# Patient Record
Sex: Male | Born: 1960 | Race: Black or African American | Hispanic: No | Marital: Married | Smoking: Former smoker
Health system: Southern US, Community
[De-identification: ages and names within clinical notes are randomized; demographics above are authoritative.]

## PROBLEM LIST (undated history)

## (undated) DIAGNOSIS — G43909 Migraine, unspecified, not intractable, without status migrainosus: Secondary | ICD-10-CM

## (undated) DIAGNOSIS — G4730 Sleep apnea, unspecified: Secondary | ICD-10-CM

## (undated) DIAGNOSIS — N289 Disorder of kidney and ureter, unspecified: Secondary | ICD-10-CM

## (undated) DIAGNOSIS — I10 Essential (primary) hypertension: Secondary | ICD-10-CM

## (undated) DIAGNOSIS — B351 Tinea unguium: Secondary | ICD-10-CM

## (undated) DIAGNOSIS — W3400XA Accidental discharge from unspecified firearms or gun, initial encounter: Secondary | ICD-10-CM

## (undated) DIAGNOSIS — J329 Chronic sinusitis, unspecified: Secondary | ICD-10-CM

## (undated) DIAGNOSIS — E119 Type 2 diabetes mellitus without complications: Secondary | ICD-10-CM

## (undated) DIAGNOSIS — I639 Cerebral infarction, unspecified: Secondary | ICD-10-CM

## (undated) HISTORY — PX: CHEST TUBE INSERTION: SHX231

---

## 1997-10-04 DIAGNOSIS — I639 Cerebral infarction, unspecified: Secondary | ICD-10-CM

## 1997-10-04 HISTORY — DX: Cerebral infarction, unspecified: I63.9

## 2016-06-02 ENCOUNTER — Emergency Department (HOSPITAL_COMMUNITY)

## 2016-06-02 ENCOUNTER — Encounter (HOSPITAL_COMMUNITY): Payer: Self-pay

## 2016-06-02 ENCOUNTER — Inpatient Hospital Stay (HOSPITAL_COMMUNITY)
Admission: EM | Admit: 2016-06-02 | Discharge: 2016-06-08 | DRG: 287 | Attending: Internal Medicine | Admitting: Internal Medicine

## 2016-06-02 DIAGNOSIS — I2510 Atherosclerotic heart disease of native coronary artery without angina pectoris: Secondary | ICD-10-CM | POA: Diagnosis not present

## 2016-06-02 DIAGNOSIS — E785 Hyperlipidemia, unspecified: Secondary | ICD-10-CM | POA: Diagnosis present

## 2016-06-02 DIAGNOSIS — R9431 Abnormal electrocardiogram [ECG] [EKG]: Principal | ICD-10-CM | POA: Diagnosis present

## 2016-06-02 DIAGNOSIS — Z79899 Other long term (current) drug therapy: Secondary | ICD-10-CM | POA: Diagnosis not present

## 2016-06-02 DIAGNOSIS — N183 Chronic kidney disease, stage 3 (moderate): Secondary | ICD-10-CM | POA: Diagnosis not present

## 2016-06-02 DIAGNOSIS — I69354 Hemiplegia and hemiparesis following cerebral infarction affecting left non-dominant side: Secondary | ICD-10-CM | POA: Diagnosis not present

## 2016-06-02 DIAGNOSIS — G4733 Obstructive sleep apnea (adult) (pediatric): Secondary | ICD-10-CM | POA: Diagnosis present

## 2016-06-02 DIAGNOSIS — H5712 Ocular pain, left eye: Secondary | ICD-10-CM

## 2016-06-02 DIAGNOSIS — E1122 Type 2 diabetes mellitus with diabetic chronic kidney disease: Secondary | ICD-10-CM | POA: Diagnosis present

## 2016-06-02 DIAGNOSIS — Z8249 Family history of ischemic heart disease and other diseases of the circulatory system: Secondary | ICD-10-CM | POA: Diagnosis not present

## 2016-06-02 DIAGNOSIS — E119 Type 2 diabetes mellitus without complications: Secondary | ICD-10-CM

## 2016-06-02 DIAGNOSIS — Z87891 Personal history of nicotine dependence: Secondary | ICD-10-CM

## 2016-06-02 DIAGNOSIS — I129 Hypertensive chronic kidney disease with stage 1 through stage 4 chronic kidney disease, or unspecified chronic kidney disease: Secondary | ICD-10-CM | POA: Diagnosis not present

## 2016-06-02 DIAGNOSIS — R079 Chest pain, unspecified: Secondary | ICD-10-CM

## 2016-06-02 DIAGNOSIS — R0600 Dyspnea, unspecified: Secondary | ICD-10-CM

## 2016-06-02 DIAGNOSIS — I10 Essential (primary) hypertension: Secondary | ICD-10-CM | POA: Diagnosis present

## 2016-06-02 DIAGNOSIS — R7989 Other specified abnormal findings of blood chemistry: Secondary | ICD-10-CM | POA: Diagnosis present

## 2016-06-02 DIAGNOSIS — R0609 Other forms of dyspnea: Secondary | ICD-10-CM | POA: Diagnosis not present

## 2016-06-02 DIAGNOSIS — Z8673 Personal history of transient ischemic attack (TIA), and cerebral infarction without residual deficits: Secondary | ICD-10-CM

## 2016-06-02 HISTORY — DX: Essential (primary) hypertension: I10

## 2016-06-02 HISTORY — DX: Chronic sinusitis, unspecified: J32.9

## 2016-06-02 HISTORY — DX: Disorder of kidney and ureter, unspecified: N28.9

## 2016-06-02 HISTORY — DX: Accidental discharge from unspecified firearms or gun, initial encounter: W34.00XA

## 2016-06-02 HISTORY — DX: Cerebral infarction, unspecified: I63.9

## 2016-06-02 HISTORY — DX: Sleep apnea, unspecified: G47.30

## 2016-06-02 HISTORY — DX: Tinea unguium: B35.1

## 2016-06-02 HISTORY — DX: Type 2 diabetes mellitus without complications: E11.9

## 2016-06-02 HISTORY — DX: Migraine, unspecified, not intractable, without status migrainosus: G43.909

## 2016-06-02 LAB — CBC WITH DIFFERENTIAL/PLATELET
Basophils Absolute: 0 10*3/uL (ref 0.0–0.1)
Basophils Relative: 0 %
EOS PCT: 2 %
Eosinophils Absolute: 0.1 10*3/uL (ref 0.0–0.7)
HCT: 39.9 % (ref 39.0–52.0)
Hemoglobin: 13.2 g/dL (ref 13.0–17.0)
LYMPHS ABS: 1.2 10*3/uL (ref 0.7–4.0)
LYMPHS PCT: 27 %
MCH: 30.3 pg (ref 26.0–34.0)
MCHC: 33.1 g/dL (ref 30.0–36.0)
MCV: 91.5 fL (ref 78.0–100.0)
MONO ABS: 0.4 10*3/uL (ref 0.1–1.0)
MONOS PCT: 8 %
Neutro Abs: 2.9 10*3/uL (ref 1.7–7.7)
Neutrophils Relative %: 63 %
PLATELETS: 201 10*3/uL (ref 150–400)
RBC: 4.36 MIL/uL (ref 4.22–5.81)
RDW: 12.2 % (ref 11.5–15.5)
WBC: 4.6 10*3/uL (ref 4.0–10.5)

## 2016-06-02 LAB — BASIC METABOLIC PANEL
Anion gap: 7 (ref 5–15)
BUN: 13 mg/dL (ref 6–20)
CALCIUM: 9.7 mg/dL (ref 8.9–10.3)
CHLORIDE: 103 mmol/L (ref 101–111)
CO2: 29 mmol/L (ref 22–32)
Creatinine, Ser: 1.59 mg/dL — ABNORMAL HIGH (ref 0.61–1.24)
GFR calc Af Amer: 55 mL/min — ABNORMAL LOW (ref >60)
GFR calc non Af Amer: 48 mL/min — ABNORMAL LOW (ref >60)
GLUCOSE: 131 mg/dL — AB (ref 65–99)
POTASSIUM: 3.9 mmol/L (ref 3.5–5.1)
Sodium: 139 mmol/L (ref 135–145)

## 2016-06-02 LAB — TROPONIN I

## 2016-06-02 LAB — BRAIN NATRIURETIC PEPTIDE: B NATRIURETIC PEPTIDE 5: 56 pg/mL (ref 0.0–100.0)

## 2016-06-02 MED ORDER — IOPAMIDOL (ISOVUE-370) INJECTION 76%
100.0000 mL | Freq: Once | INTRAVENOUS | Status: AC | PRN
  Administered 2016-06-02: 100 mL via INTRAVENOUS

## 2016-06-02 MED ORDER — ASPIRIN 81 MG PO CHEW
324.0000 mg | CHEWABLE_TABLET | Freq: Once | ORAL | Status: AC
  Administered 2016-06-02: 324 mg via ORAL
  Filled 2016-06-02: qty 4

## 2016-06-02 MED ORDER — SODIUM CHLORIDE 0.9 % IV BOLUS (SEPSIS)
1000.0000 mL | Freq: Once | INTRAVENOUS | Status: AC
  Administered 2016-06-02: 1000 mL via INTRAVENOUS

## 2016-06-02 MED ORDER — MORPHINE SULFATE (PF) 4 MG/ML IV SOLN
4.0000 mg | Freq: Once | INTRAVENOUS | Status: AC
  Administered 2016-06-02: 4 mg via INTRAVENOUS
  Filled 2016-06-02: qty 1

## 2016-06-02 NOTE — ED Triage Notes (Signed)
Pt incarcerated and reports woke up and took his cpap off and felt dizzy and sob.  Pt says throughout the day he continued to feel this way.  Also reports generalized weakness and tingling in fingers on left hand and feeling pressure behind left eye.  Pt says his hand no longer is tingling but has generalized weakness and c/o dizziness.
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
## 2016-06-03 ENCOUNTER — Observation Stay (HOSPITAL_BASED_OUTPATIENT_CLINIC_OR_DEPARTMENT_OTHER)

## 2016-06-03 DIAGNOSIS — E119 Type 2 diabetes mellitus without complications: Secondary | ICD-10-CM

## 2016-06-03 DIAGNOSIS — R7989 Other specified abnormal findings of blood chemistry: Secondary | ICD-10-CM | POA: Diagnosis present

## 2016-06-03 DIAGNOSIS — R0600 Dyspnea, unspecified: Secondary | ICD-10-CM | POA: Diagnosis not present

## 2016-06-03 DIAGNOSIS — G4733 Obstructive sleep apnea (adult) (pediatric): Secondary | ICD-10-CM

## 2016-06-03 DIAGNOSIS — Z8673 Personal history of transient ischemic attack (TIA), and cerebral infarction without residual deficits: Secondary | ICD-10-CM

## 2016-06-03 DIAGNOSIS — R748 Abnormal levels of other serum enzymes: Secondary | ICD-10-CM | POA: Diagnosis not present

## 2016-06-03 DIAGNOSIS — R9431 Abnormal electrocardiogram [ECG] [EKG]: Secondary | ICD-10-CM | POA: Diagnosis not present

## 2016-06-03 DIAGNOSIS — I10 Essential (primary) hypertension: Secondary | ICD-10-CM | POA: Diagnosis present

## 2016-06-03 DIAGNOSIS — R0609 Other forms of dyspnea: Secondary | ICD-10-CM

## 2016-06-03 LAB — ECHOCARDIOGRAM COMPLETE
HEIGHTINCHES: 72 in
WEIGHTICAEL: 3609.6 [oz_av]

## 2016-06-03 LAB — TROPONIN I: Troponin I: <0.03 ng/mL (ref <0.03)

## 2016-06-03 LAB — LIPID PANEL
CHOLESTEROL: 160 mg/dL (ref 0–200)
HDL: 31 mg/dL — AB (ref >40)
LDL CALC: 108 mg/dL — AB (ref 0–99)
TRIGLYCERIDES: 106 mg/dL (ref <150)
Total CHOL/HDL Ratio: 5.2 RATIO
VLDL: 21 mg/dL (ref 0–40)

## 2016-06-03 LAB — TSH: TSH: 0.265 u[IU]/mL — AB (ref 0.350–4.500)

## 2016-06-03 MED ORDER — LORATADINE 10 MG PO TABS
10.0000 mg | ORAL_TABLET | Freq: Every day | ORAL | Status: DC
  Administered 2016-06-03 – 2016-06-08 (×6): 10 mg via ORAL
  Filled 2016-06-03 (×6): qty 1

## 2016-06-03 MED ORDER — TRIAMCINOLONE ACETONIDE 55 MCG/ACT NA AERO: 2.0000 | INHALATION_SPRAY | Freq: Every day | NASAL | Status: DC | PRN

## 2016-06-03 MED ORDER — CLONIDINE HCL 0.1 MG PO TABS
0.1000 mg | ORAL_TABLET | Freq: Three times a day (TID) | ORAL | Status: DC
  Administered 2016-06-03 – 2016-06-05 (×8): 0.1 mg via ORAL
  Filled 2016-06-03 (×9): qty 1

## 2016-06-03 MED ORDER — VERAPAMIL HCL ER 240 MG PO TBCR
240.0000 mg | EXTENDED_RELEASE_TABLET | Freq: Every day | ORAL | Status: DC
  Administered 2016-06-03 – 2016-06-07 (×5): 240 mg via ORAL
  Filled 2016-06-03 (×6): qty 1

## 2016-06-03 MED ORDER — HYDROCHLOROTHIAZIDE 25 MG PO TABS
25.0000 mg | ORAL_TABLET | Freq: Every day | ORAL | Status: DC
  Administered 2016-06-03 – 2016-06-04 (×2): 25 mg via ORAL
  Filled 2016-06-03 (×2): qty 1

## 2016-06-03 MED ORDER — GI COCKTAIL ~~LOC~~
30.0000 mL | Freq: Four times a day (QID) | ORAL | Status: DC | PRN
  Administered 2016-06-07: 30 mL via ORAL
  Filled 2016-06-03: qty 30

## 2016-06-03 MED ORDER — ATORVASTATIN CALCIUM 40 MG PO TABS
40.0000 mg | ORAL_TABLET | Freq: Every day | ORAL | Status: DC
  Administered 2016-06-03 – 2016-06-07 (×5): 40 mg via ORAL
  Filled 2016-06-03 (×7): qty 1

## 2016-06-03 MED ORDER — ASPIRIN EC 325 MG PO TBEC
325.0000 mg | DELAYED_RELEASE_TABLET | Freq: Every day | ORAL | Status: DC
  Administered 2016-06-03 – 2016-06-08 (×6): 325 mg via ORAL
  Filled 2016-06-03 (×6): qty 1

## 2016-06-03 MED ORDER — MORPHINE SULFATE (PF) 2 MG/ML IV SOLN
2.0000 mg | INTRAVENOUS | Status: DC | PRN
  Administered 2016-06-03 – 2016-06-06 (×6): 2 mg via INTRAVENOUS
  Filled 2016-06-03 (×7): qty 1

## 2016-06-03 MED ORDER — ENOXAPARIN SODIUM 40 MG/0.4ML ~~LOC~~ SOLN
40.0000 mg | Freq: Every day | SUBCUTANEOUS | Status: DC
  Administered 2016-06-03 – 2016-06-07 (×5): 40 mg via SUBCUTANEOUS
  Filled 2016-06-03 (×5): qty 0.4

## 2016-06-03 MED ORDER — ASPIRIN 325 MG PO TABS: 325.0000 mg | ORAL_TABLET | Freq: Every day | ORAL | Status: DC

## 2016-06-03 MED ORDER — ONDANSETRON HCL 4 MG/2ML IJ SOLN: 4.0000 mg | Freq: Four times a day (QID) | INTRAMUSCULAR | Status: DC | PRN

## 2016-06-03 NOTE — Progress Notes (Signed)
Echocardiogram 2D Echocardiogram has been performed.  Marisue Humblelexis N Andelyn Spade 06/03/2016, 11:37 AM

## 2016-06-03 NOTE — Progress Notes (Signed)
Patient lying in bed, guard present at bedside. Call light within reach
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
## 2016-06-03 NOTE — Progress Notes (Signed)
Patient received via [facility]. Oriented to room, guard present. Tele monitoring and skin assessment completed. No other needs at this time, call light within reach

## 2016-06-03 NOTE — Progress Notes (Signed)
Spoke with pharmacy regarding clonidine due every 8 hours but scheduled for 01:45 and 06:00.Per the pharmacy, ok to still give.

## 2016-06-03 NOTE — Progress Notes (Signed)
Patient placed patient on CPAP HS auto. NO O2 bleed in needed. Patient tolerating well.

## 2016-06-04 ENCOUNTER — Observation Stay (HOSPITAL_COMMUNITY)

## 2016-06-04 ENCOUNTER — Encounter (HOSPITAL_COMMUNITY): Payer: Self-pay | Admitting: Radiology

## 2016-06-04 DIAGNOSIS — R0600 Dyspnea, unspecified: Secondary | ICD-10-CM

## 2016-06-04 DIAGNOSIS — E119 Type 2 diabetes mellitus without complications: Secondary | ICD-10-CM | POA: Diagnosis not present

## 2016-06-04 DIAGNOSIS — W3400XA Accidental discharge from unspecified firearms or gun, initial encounter: Secondary | ICD-10-CM | POA: Insufficient documentation

## 2016-06-04 DIAGNOSIS — R0609 Other forms of dyspnea: Secondary | ICD-10-CM | POA: Diagnosis not present

## 2016-06-04 DIAGNOSIS — R9431 Abnormal electrocardiogram [ECG] [EKG]: Secondary | ICD-10-CM | POA: Diagnosis not present

## 2016-06-04 DIAGNOSIS — R748 Abnormal levels of other serum enzymes: Secondary | ICD-10-CM | POA: Diagnosis not present

## 2016-06-04 LAB — NM MYOCAR MULTI W/SPECT W/WALL MOTION / EF
CHL CUP NUCLEAR SDS: 6
CHL CUP RESTING HR STRESS: -61 {beats}/min
CHL CUP STRESS STAGE 1 HR: 62 {beats}/min
CHL CUP STRESS STAGE 2 SPEED: 0 mph
CHL CUP STRESS STAGE 3 DBP: 64 mmHg
CHL CUP STRESS STAGE 3 GRADE: 0 %
CHL CUP STRESS STAGE 3 HR: 85 {beats}/min
CHL CUP STRESS STAGE 3 SBP: 117 mmHg
CHL CUP STRESS STAGE 3 SPEED: 0 mph
CHL CUP STRESS STAGE 4 GRADE: 0 %
CHL CUP STRESS STAGE 4 HR: 83 {beats}/min
CHL CUP STRESS STAGE 4 SBP: 119 mmHg
CSEPED: 5 min
CSEPEDS: 21 s
CSEPHR: 54 %
CSEPPBP: 119 mmHg
CSEPPMHR: 50 %
Estimated workload: 1 METS
LV sys vol: 69 mL
LVDIAVOL: 141 mL (ref 62–150)
MPHR: 166 {beats}/min
Peak HR: 83 {beats}/min
RATE: 0.31
SRS: 0
SSS: 6
Stage 1 DBP: 83 mmHg
Stage 1 Grade: 0 %
Stage 1 SBP: 129 mmHg
Stage 1 Speed: 0 mph
Stage 2 Grade: 0 %
Stage 2 HR: 62 {beats}/min
Stage 4 DBP: 69 mmHg
Stage 4 Speed: 0 mph
TID: 1.4

## 2016-06-04 LAB — HEMOGLOBIN A1C
Hgb A1c MFr Bld: 5.8 % — ABNORMAL HIGH (ref 4.8–5.6)
Mean Plasma Glucose: 120 mg/dL

## 2016-06-04 LAB — BASIC METABOLIC PANEL
ANION GAP: 6 (ref 5–15)
BUN: 20 mg/dL (ref 6–20)
CALCIUM: 9.3 mg/dL (ref 8.9–10.3)
CO2: 29 mmol/L (ref 22–32)
Chloride: 101 mmol/L (ref 101–111)
Creatinine, Ser: 1.88 mg/dL — ABNORMAL HIGH (ref 0.61–1.24)
GFR, EST AFRICAN AMERICAN: 45 mL/min — AB (ref >60)
GFR, EST NON AFRICAN AMERICAN: 39 mL/min — AB (ref >60)
GLUCOSE: 121 mg/dL — AB (ref 65–99)
POTASSIUM: 4.3 mmol/L (ref 3.5–5.1)
Sodium: 136 mmol/L (ref 135–145)

## 2016-06-04 MED ORDER — REGADENOSON 0.4 MG/5ML IV SOLN
0.4000 mg | Freq: Once | INTRAVENOUS | Status: AC
  Administered 2016-06-04: 0.4 mg via INTRAVENOUS
  Filled 2016-06-04: qty 5

## 2016-06-04 MED ORDER — REGADENOSON 0.4 MG/5ML IV SOLN
INTRAVENOUS | Status: AC
  Filled 2016-06-04: qty 5

## 2016-06-04 MED ORDER — TECHNETIUM TC 99M TETROFOSMIN IV KIT
30.0000 | PACK | Freq: Once | INTRAVENOUS | Status: AC | PRN
  Administered 2016-06-04: 30 via INTRAVENOUS

## 2016-06-04 MED ORDER — IBUPROFEN 200 MG PO TABS
400.0000 mg | ORAL_TABLET | Freq: Four times a day (QID) | ORAL | Status: DC | PRN
  Filled 2016-06-04: qty 2

## 2016-06-04 MED ORDER — IOPAMIDOL (ISOVUE-300) INJECTION 61%
INTRAVENOUS | Status: AC
  Administered 2016-06-04: 75 mL
  Filled 2016-06-04: qty 75

## 2016-06-04 MED ORDER — TECHNETIUM TC 99M TETROFOSMIN IV KIT
10.0000 | PACK | Freq: Once | INTRAVENOUS | Status: AC | PRN
  Administered 2016-06-04: 10 via INTRAVENOUS
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
## 2016-06-04 NOTE — Progress Notes (Signed)
Lexiscan MV performed, 1 day study, [facility] to read  Barrett, RhonTheodore Demarkda, PA-C 06/04/2016 10:08 AM Beeper [phone]
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
## 2016-06-05 DIAGNOSIS — R0609 Other forms of dyspnea: Secondary | ICD-10-CM | POA: Diagnosis not present

## 2016-06-05 DIAGNOSIS — E119 Type 2 diabetes mellitus without complications: Secondary | ICD-10-CM | POA: Diagnosis not present

## 2016-06-05 DIAGNOSIS — R0789 Other chest pain: Secondary | ICD-10-CM | POA: Diagnosis not present

## 2016-06-05 DIAGNOSIS — R748 Abnormal levels of other serum enzymes: Secondary | ICD-10-CM | POA: Diagnosis not present

## 2016-06-05 DIAGNOSIS — I10 Essential (primary) hypertension: Secondary | ICD-10-CM | POA: Diagnosis not present

## 2016-06-05 DIAGNOSIS — R9431 Abnormal electrocardiogram [ECG] [EKG]: Secondary | ICD-10-CM | POA: Diagnosis present

## 2016-06-05 DIAGNOSIS — E1122 Type 2 diabetes mellitus with diabetic chronic kidney disease: Secondary | ICD-10-CM | POA: Diagnosis present

## 2016-06-05 DIAGNOSIS — Z8249 Family history of ischemic heart disease and other diseases of the circulatory system: Secondary | ICD-10-CM | POA: Diagnosis not present

## 2016-06-05 DIAGNOSIS — R0600 Dyspnea, unspecified: Secondary | ICD-10-CM

## 2016-06-05 DIAGNOSIS — G4733 Obstructive sleep apnea (adult) (pediatric): Secondary | ICD-10-CM | POA: Diagnosis present

## 2016-06-05 DIAGNOSIS — I2510 Atherosclerotic heart disease of native coronary artery without angina pectoris: Secondary | ICD-10-CM | POA: Diagnosis present

## 2016-06-05 DIAGNOSIS — Z79899 Other long term (current) drug therapy: Secondary | ICD-10-CM | POA: Diagnosis not present

## 2016-06-05 DIAGNOSIS — N183 Chronic kidney disease, stage 3 (moderate): Secondary | ICD-10-CM | POA: Diagnosis present

## 2016-06-05 DIAGNOSIS — R079 Chest pain, unspecified: Secondary | ICD-10-CM

## 2016-06-05 DIAGNOSIS — I129 Hypertensive chronic kidney disease with stage 1 through stage 4 chronic kidney disease, or unspecified chronic kidney disease: Secondary | ICD-10-CM | POA: Diagnosis present

## 2016-06-05 DIAGNOSIS — I69354 Hemiplegia and hemiparesis following cerebral infarction affecting left non-dominant side: Secondary | ICD-10-CM | POA: Diagnosis not present

## 2016-06-05 DIAGNOSIS — E785 Hyperlipidemia, unspecified: Secondary | ICD-10-CM | POA: Diagnosis present

## 2016-06-05 DIAGNOSIS — Z87891 Personal history of nicotine dependence: Secondary | ICD-10-CM | POA: Diagnosis not present

## 2016-06-05 LAB — BASIC METABOLIC PANEL
ANION GAP: 7 (ref 5–15)
BUN: 19 mg/dL (ref 6–20)
CALCIUM: 9.4 mg/dL (ref 8.9–10.3)
CO2: 29 mmol/L (ref 22–32)
Chloride: 102 mmol/L (ref 101–111)
Creatinine, Ser: 1.9 mg/dL — ABNORMAL HIGH (ref 0.61–1.24)
GFR calc Af Amer: 45 mL/min — ABNORMAL LOW (ref >60)
GFR, EST NON AFRICAN AMERICAN: 38 mL/min — AB (ref >60)
GLUCOSE: 131 mg/dL — AB (ref 65–99)
Potassium: 3.9 mmol/L (ref 3.5–5.1)
Sodium: 138 mmol/L (ref 135–145)

## 2016-06-05 LAB — CBC
HCT: 41.4 % (ref 39.0–52.0)
Hemoglobin: 13.1 g/dL (ref 13.0–17.0)
MCH: 29.6 pg (ref 26.0–34.0)
MCHC: 31.6 g/dL (ref 30.0–36.0)
MCV: 93.7 fL (ref 78.0–100.0)
PLATELETS: 211 10*3/uL (ref 150–400)
RBC: 4.42 MIL/uL (ref 4.22–5.81)
RDW: 12.3 % (ref 11.5–15.5)
WBC: 4.9 10*3/uL (ref 4.0–10.5)

## 2016-06-05 LAB — MRSA PCR SCREENING: MRSA BY PCR: NEGATIVE

## 2016-06-05 MED ORDER — MAGNESIUM HYDROXIDE 400 MG/5ML PO SUSP
15.0000 mL | Freq: Every day | ORAL | Status: DC | PRN
  Administered 2016-06-06: 15 mL via ORAL
  Filled 2016-06-05: qty 30

## 2016-06-05 MED ORDER — ISOSORBIDE MONONITRATE ER 30 MG PO TB24
15.0000 mg | ORAL_TABLET | Freq: Every day | ORAL | Status: DC
  Administered 2016-06-05 – 2016-06-06 (×2): 15 mg via ORAL
  Filled 2016-06-05 (×2): qty 1

## 2016-06-05 MED ORDER — CARVEDILOL 3.125 MG PO TABS
3.1250 mg | ORAL_TABLET | Freq: Two times a day (BID) | ORAL | Status: DC
  Administered 2016-06-05 – 2016-06-08 (×7): 3.125 mg via ORAL
  Filled 2016-06-05 (×8): qty 1

## 2016-06-05 MED ORDER — CLONIDINE HCL 0.1 MG PO TABS
0.1000 mg | ORAL_TABLET | Freq: Two times a day (BID) | ORAL | Status: DC
  Administered 2016-06-05 – 2016-06-08 (×6): 0.1 mg via ORAL
  Filled 2016-06-05 (×6): qty 1

## 2016-06-05 MED ORDER — SODIUM CHLORIDE 0.9 % IV SOLN
INTRAVENOUS | Status: DC
  Administered 2016-06-05 – 2016-06-06 (×2): via INTRAVENOUS
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
## 2016-06-05 NOTE — Progress Notes (Signed)
RT set up patient CPAP HS. Patient needs no assistance in putting on mask. NO O2 bleed in needed.
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
## 2016-06-06 DIAGNOSIS — I10 Essential (primary) hypertension: Secondary | ICD-10-CM

## 2016-06-06 DIAGNOSIS — R748 Abnormal levels of other serum enzymes: Secondary | ICD-10-CM

## 2016-06-06 LAB — CBC
HCT: 38.5 % — ABNORMAL LOW (ref 39.0–52.0)
Hemoglobin: 12.3 g/dL — ABNORMAL LOW (ref 13.0–17.0)
MCH: 29.9 pg (ref 26.0–34.0)
MCHC: 31.9 g/dL (ref 30.0–36.0)
MCV: 93.7 fL (ref 78.0–100.0)
PLATELETS: 187 10*3/uL (ref 150–400)
RBC: 4.11 MIL/uL — ABNORMAL LOW (ref 4.22–5.81)
RDW: 12.4 % (ref 11.5–15.5)
WBC: 4.8 10*3/uL (ref 4.0–10.5)

## 2016-06-06 LAB — BASIC METABOLIC PANEL
Anion gap: 5 (ref 5–15)
BUN: 19 mg/dL (ref 6–20)
CO2: 29 mmol/L (ref 22–32)
CREATININE: 1.83 mg/dL — AB (ref 0.61–1.24)
Calcium: 9 mg/dL (ref 8.9–10.3)
Chloride: 105 mmol/L (ref 101–111)
GFR calc Af Amer: 47 mL/min — ABNORMAL LOW (ref >60)
GFR, EST NON AFRICAN AMERICAN: 40 mL/min — AB (ref >60)
Glucose, Bld: 134 mg/dL — ABNORMAL HIGH (ref 65–99)
Potassium: 3.8 mmol/L (ref 3.5–5.1)
SODIUM: 139 mmol/L (ref 135–145)

## 2016-06-06 MED ORDER — SODIUM CHLORIDE 0.9 % IV SOLN: INTRAVENOUS | Status: DC
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
## 2016-06-07 LAB — BASIC METABOLIC PANEL
ANION GAP: 7 (ref 5–15)
BUN: 17 mg/dL (ref 6–20)
CALCIUM: 9.3 mg/dL (ref 8.9–10.3)
CO2: 29 mmol/L (ref 22–32)
CREATININE: 1.76 mg/dL — AB (ref 0.61–1.24)
Chloride: 103 mmol/L (ref 101–111)
GFR calc non Af Amer: 42 mL/min — ABNORMAL LOW (ref >60)
GFR, EST AFRICAN AMERICAN: 49 mL/min — AB (ref >60)
Glucose, Bld: 117 mg/dL — ABNORMAL HIGH (ref 65–99)
Potassium: 4.4 mmol/L (ref 3.5–5.1)
SODIUM: 139 mmol/L (ref 135–145)

## 2016-06-07 MED ORDER — SODIUM CHLORIDE 0.9 % WEIGHT BASED INFUSION
1.0000 mL/kg/h | INTRAVENOUS | Status: DC
  Administered 2016-06-08: 1 mL/kg/h via INTRAVENOUS

## 2016-06-07 MED ORDER — SODIUM CHLORIDE 0.9 % WEIGHT BASED INFUSION
3.0000 mL/kg/h | INTRAVENOUS | Status: DC
  Administered 2016-06-07: 3 mL/kg/h via INTRAVENOUS

## 2016-06-07 MED ORDER — HYDROCODONE-ACETAMINOPHEN 5-325 MG PO TABS
1.0000 | ORAL_TABLET | Freq: Four times a day (QID) | ORAL | Status: DC | PRN
  Administered 2016-06-07 – 2016-06-08 (×2): 1 via ORAL
  Filled 2016-06-07 (×2): qty 1

## 2016-06-07 MED ORDER — SODIUM CHLORIDE 0.9 % IV SOLN: 250.0000 mL | INTRAVENOUS | Status: DC | PRN

## 2016-06-07 MED ORDER — ASPIRIN 81 MG PO CHEW
81.0000 mg | CHEWABLE_TABLET | ORAL | Status: AC
  Administered 2016-06-08: 81 mg via ORAL
  Filled 2016-06-07: qty 1

## 2016-06-07 MED ORDER — SODIUM CHLORIDE 0.9% FLUSH
3.0000 mL | Freq: Two times a day (BID) | INTRAVENOUS | Status: DC
  Administered 2016-06-07: 3 mL via INTRAVENOUS

## 2016-06-07 MED ORDER — SODIUM CHLORIDE 0.9% FLUSH: 3.0000 mL | INTRAVENOUS | Status: DC | PRN

## 2016-06-07 NOTE — Progress Notes (Signed)
Patient states that he will place CPAP on herself. Humidification chamber filled and RT will monitor as needed.
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
## 2016-06-08 ENCOUNTER — Encounter (HOSPITAL_COMMUNITY): Admission: EM | Payer: Self-pay | Source: Home / Self Care | Attending: Internal Medicine

## 2016-06-08 ENCOUNTER — Encounter (HOSPITAL_COMMUNITY): Payer: Self-pay | Admitting: Cardiology

## 2016-06-08 DIAGNOSIS — R0789 Other chest pain: Secondary | ICD-10-CM

## 2016-06-08 HISTORY — PX: CARDIAC CATHETERIZATION: SHX172

## 2016-06-08 LAB — BASIC METABOLIC PANEL
Anion gap: 7 (ref 5–15)
BUN: 19 mg/dL (ref 6–20)
CO2: 27 mmol/L (ref 22–32)
Calcium: 9.3 mg/dL (ref 8.9–10.3)
Chloride: 105 mmol/L (ref 101–111)
Creatinine, Ser: 1.73 mg/dL — ABNORMAL HIGH (ref 0.61–1.24)
GFR calc Af Amer: 50 mL/min — ABNORMAL LOW (ref >60)
GFR, EST NON AFRICAN AMERICAN: 43 mL/min — AB (ref >60)
GLUCOSE: 148 mg/dL — AB (ref 65–99)
POTASSIUM: 4 mmol/L (ref 3.5–5.1)
Sodium: 139 mmol/L (ref 135–145)

## 2016-06-08 LAB — CBC
HCT: 40.9 % (ref 39.0–52.0)
Hemoglobin: 12.8 g/dL — ABNORMAL LOW (ref 13.0–17.0)
MCH: 29.5 pg (ref 26.0–34.0)
MCHC: 31.3 g/dL (ref 30.0–36.0)
MCV: 94.2 fL (ref 78.0–100.0)
PLATELETS: 193 10*3/uL (ref 150–400)
RBC: 4.34 MIL/uL (ref 4.22–5.81)
RDW: 12.2 % (ref 11.5–15.5)
WBC: 4 10*3/uL (ref 4.0–10.5)

## 2016-06-08 LAB — CREATININE, SERUM
CREATININE: 1.64 mg/dL — AB (ref 0.61–1.24)
GFR calc Af Amer: 53 mL/min — ABNORMAL LOW (ref >60)
GFR calc non Af Amer: 46 mL/min — ABNORMAL LOW (ref >60)

## 2016-06-08 LAB — PROTIME-INR
INR: 0.99
Prothrombin Time: 13.1 seconds (ref 11.4–15.2)

## 2016-06-08 SURGERY — LEFT HEART CATH AND CORONARY ANGIOGRAPHY
Anesthesia: LOCAL

## 2016-06-08 MED ORDER — MIDAZOLAM HCL 2 MG/2ML IJ SOLN
INTRAMUSCULAR | Status: DC | PRN
  Administered 2016-06-08: 1 mg via INTRAVENOUS

## 2016-06-08 MED ORDER — SODIUM CHLORIDE 0.9% FLUSH: 3.0000 mL | INTRAVENOUS | Status: DC | PRN

## 2016-06-08 MED ORDER — CARVEDILOL 3.125 MG PO TABS: 3.1250 mg | ORAL_TABLET | Freq: Two times a day (BID) | ORAL | 0 refills | Status: AC

## 2016-06-08 MED ORDER — SODIUM CHLORIDE 0.9 % WEIGHT BASED INFUSION
1.0000 mL/kg/h | INTRAVENOUS | Status: AC
  Administered 2016-06-08: 1 mL/kg/h via INTRAVENOUS

## 2016-06-08 MED ORDER — HEPARIN SODIUM (PORCINE) 1000 UNIT/ML IJ SOLN
INTRAMUSCULAR | Status: AC
  Filled 2016-06-08: qty 1

## 2016-06-08 MED ORDER — FENTANYL CITRATE (PF) 100 MCG/2ML IJ SOLN
INTRAMUSCULAR | Status: AC
  Filled 2016-06-08: qty 2

## 2016-06-08 MED ORDER — HEPARIN (PORCINE) IN NACL 2-0.9 UNIT/ML-% IJ SOLN
INTRAMUSCULAR | Status: AC
  Filled 2016-06-08: qty 1000

## 2016-06-08 MED ORDER — ISOSORBIDE MONONITRATE ER 30 MG PO TB24: 15.0000 mg | ORAL_TABLET | Freq: Every day | ORAL | 0 refills | Status: AC

## 2016-06-08 MED ORDER — HEPARIN (PORCINE) IN NACL 2-0.9 UNIT/ML-% IJ SOLN
INTRAMUSCULAR | Status: DC | PRN
  Administered 2016-06-08: 1000 mL

## 2016-06-08 MED ORDER — ASPIRIN 325 MG PO TBEC: 325.0000 mg | DELAYED_RELEASE_TABLET | Freq: Every day | ORAL | 0 refills | Status: AC

## 2016-06-08 MED ORDER — MIDAZOLAM HCL 2 MG/2ML IJ SOLN
INTRAMUSCULAR | Status: AC
  Filled 2016-06-08: qty 2

## 2016-06-08 MED ORDER — FENTANYL CITRATE (PF) 100 MCG/2ML IJ SOLN
INTRAMUSCULAR | Status: DC | PRN
  Administered 2016-06-08: 25 ug via INTRAVENOUS

## 2016-06-08 MED ORDER — LIDOCAINE HCL (PF) 1 % IJ SOLN
INTRAMUSCULAR | Status: DC | PRN
  Administered 2016-06-08: 2 mL

## 2016-06-08 MED ORDER — SODIUM CHLORIDE 0.9% FLUSH: 3.0000 mL | Freq: Two times a day (BID) | INTRAVENOUS | Status: DC

## 2016-06-08 MED ORDER — HEPARIN SODIUM (PORCINE) 1000 UNIT/ML IJ SOLN
INTRAMUSCULAR | Status: DC | PRN
  Administered 2016-06-08: 5000 [IU] via INTRAVENOUS

## 2016-06-08 MED ORDER — SODIUM CHLORIDE 0.9 % IV SOLN: 250.0000 mL | INTRAVENOUS | Status: DC | PRN

## 2016-06-08 MED ORDER — VERAPAMIL HCL 2.5 MG/ML IV SOLN
INTRAVENOUS | Status: AC
  Filled 2016-06-08: qty 2

## 2016-06-08 MED ORDER — LIDOCAINE HCL (PF) 1 % IJ SOLN
INTRAMUSCULAR | Status: AC
  Filled 2016-06-08: qty 30

## 2016-06-08 MED ORDER — ENOXAPARIN SODIUM 40 MG/0.4ML ~~LOC~~ SOLN: 40.0000 mg | SUBCUTANEOUS | Status: DC

## 2016-06-08 MED ORDER — IOPAMIDOL (ISOVUE-370) INJECTION 76%
INTRAVENOUS | Status: AC
  Filled 2016-06-08: qty 100

## 2016-06-08 MED ORDER — IOPAMIDOL (ISOVUE-370) INJECTION 76%
INTRAVENOUS | Status: DC | PRN
  Administered 2016-06-08: 70 mL via INTRA_ARTERIAL

## 2016-06-08 SURGICAL SUPPLY — 11 items
CATH INFINITI 5 FR JL3.5 (CATHETERS) ×2 IMPLANT
CATH INFINITI 5FR AL1 (CATHETERS) ×2 IMPLANT
CATH LAUNCHER 5F JR4 (CATHETERS) ×2 IMPLANT
DEVICE RAD COMP TR BAND LRG (VASCULAR PRODUCTS) ×2 IMPLANT
GLIDESHEATH SLEND SS 6F .021 (SHEATH) ×2 IMPLANT
KIT HEART LEFT (KITS) ×2 IMPLANT
PACK CARDIAC CATHETERIZATION (CUSTOM PROCEDURE TRAY) ×2 IMPLANT
TRANSDUCER W/STOPCOCK (MISCELLANEOUS) ×2 IMPLANT
TUBING CIL FLEX 10 FLL-RA (TUBING) ×2 IMPLANT
WIRE EMERALD 3MM-J .035X260CM (WIRE) ×2 IMPLANT
WIRE HI TORQ VERSACORE-J 145CM (WIRE) ×2 IMPLANT

## 2016-06-08 NOTE — Interval H&P Note (Signed)
History and Physical Interval Note:  06/08/2016 9:42 AM  Ryan Cook  has presented today for surgery, with the diagnosis of unstable angina  The various methods of treatment have been discussed with the patient and family. After consideration of risks, benefits and other options for treatment, the patient has consented to  Procedure(s): Left Heart Cath and Coronary Angiography (N/A) as a surgical intervention .  The patient's history has been reviewed, patient examined, no change in status, stable for surgery.  I have reviewed the patient's chart and labs.  Questions were answered to the patient's satisfaction.   Cath Lab Visit (complete for each Cath Lab visit)  Clinical Evaluation Leading to the Procedure:   ACS: Yes.    Non-ACS:    Anginal Classification: CCS IV  Anti-ischemic medical therapy: Minimal Therapy (1 class of medications)  Non-Invasive Test Results: Intermediate-risk stress test findings: cardiac mortality 1-3%/year  Prior CABG: No previous CABG        Ryan Cook,Ryan Cook 06/08/2016 9:43 AM

## 2016-06-08 NOTE — Progress Notes (Signed)
Confirmed with Cardiology that Pt is appropriate for DC today.  Will plan to DC pt into custody of C.O. Present at bedside once Pt ambulates safely.
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
## 2016-06-08 NOTE — Progress Notes (Signed)
TR band removed.  Site unremarkable. Small pressure dressing placed using folded 2x2 and tegaderm.  Pt understands mandatory additional 30 mins bedrest and obs.
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
## 2016-06-08 NOTE — Progress Notes (Signed)
Call made to [facility]- [phone] to get approval for pt's return today- Pt has been cleared by MD here and d/c order written- [facility]- Muea NP states that pt ok for return to facility- spoke with officer at bedside and confirmed that pt had approval to return from the Triage. Pt to d/c with Officer at bedside has prescriptions and d/c paperwork in hand.

## 2016-06-11 MED FILL — Verapamil HCl IV Soln 2.5 MG/ML: INTRAVENOUS | Qty: 2 | Status: AC
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
